# Patient Record
Sex: Male | Born: 1985 | Race: White | Hispanic: Yes | Marital: Married | Smoking: Current every day smoker
Health system: Southern US, Community
[De-identification: ages and names within clinical notes are randomized; demographics above are authoritative.]

---

## 2006-05-05 ENCOUNTER — Emergency Department (HOSPITAL_COMMUNITY): Admission: EM | Admit: 2006-05-05 | Discharge: 2006-05-05 | Payer: Self-pay | Admitting: Emergency Medicine
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
## 2017-03-14 ENCOUNTER — Emergency Department (HOSPITAL_COMMUNITY)
Admission: EM | Admit: 2017-03-14 | Discharge: 2017-03-14 | Disposition: A | Discharge status: Home / Self Care | Payer: Self-pay | Attending: Emergency Medicine | Admitting: Emergency Medicine

## 2017-03-14 ENCOUNTER — Encounter (HOSPITAL_COMMUNITY): Payer: Self-pay | Admitting: Emergency Medicine

## 2017-03-14 DIAGNOSIS — R1012 Left upper quadrant pain: Secondary | ICD-10-CM | POA: Insufficient documentation

## 2017-03-14 LAB — CBC WITH DIFFERENTIAL/PLATELET
BASOS PCT: 1 %
Basophils Absolute: 0 10*3/uL (ref 0.0–0.1)
EOS ABS: 0 10*3/uL (ref 0.0–0.7)
Eosinophils Relative: 1 %
HCT: 45.6 % (ref 39.0–52.0)
HEMOGLOBIN: 16 g/dL (ref 13.0–17.0)
Lymphocytes Relative: 31 %
Lymphs Abs: 2 10*3/uL (ref 0.7–4.0)
MCH: 29.8 pg (ref 26.0–34.0)
MCHC: 35.1 g/dL (ref 30.0–36.0)
MCV: 84.9 fL (ref 78.0–100.0)
MONO ABS: 0.4 10*3/uL (ref 0.1–1.0)
MONOS PCT: 6 %
NEUTROS ABS: 4 10*3/uL (ref 1.7–7.7)
Neutrophils Relative %: 61 %
PLATELETS: 256 10*3/uL (ref 150–400)
RBC: 5.37 MIL/uL (ref 4.22–5.81)
RDW: 12.1 % (ref 11.5–15.5)
WBC: 6.4 10*3/uL (ref 4.0–10.5)

## 2017-03-14 LAB — URINALYSIS, ROUTINE W REFLEX MICROSCOPIC
Bilirubin Urine: NEGATIVE
GLUCOSE, UA: NEGATIVE mg/dL
HGB URINE DIPSTICK: NEGATIVE
KETONES UR: NEGATIVE mg/dL
LEUKOCYTES UA: NEGATIVE
Nitrite: NEGATIVE
PROTEIN: NEGATIVE mg/dL
Specific Gravity, Urine: 1.013 (ref 1.005–1.030)
pH: 8 (ref 5.0–8.0)

## 2017-03-14 LAB — COMPREHENSIVE METABOLIC PANEL
ALBUMIN: 4.4 g/dL (ref 3.5–5.0)
ALK PHOS: 48 U/L (ref 38–126)
ALT: 27 U/L (ref 17–63)
ANION GAP: 6 (ref 5–15)
AST: 24 U/L (ref 15–41)
BUN: 9 mg/dL (ref 6–20)
CALCIUM: 9.4 mg/dL (ref 8.9–10.3)
CHLORIDE: 104 mmol/L (ref 101–111)
CO2: 27 mmol/L (ref 22–32)
CREATININE: 0.67 mg/dL (ref 0.61–1.24)
GFR calc Af Amer: >60 mL/min (ref >60)
GFR calc non Af Amer: >60 mL/min (ref >60)
GLUCOSE: 104 mg/dL — AB (ref 65–99)
Potassium: 4.2 mmol/L (ref 3.5–5.1)
SODIUM: 137 mmol/L (ref 135–145)
Total Bilirubin: 0.7 mg/dL (ref 0.3–1.2)
Total Protein: 7.1 g/dL (ref 6.5–8.1)

## 2017-03-14 LAB — LIPASE, BLOOD: LIPASE: 22 U/L (ref 11–51)

## 2017-03-14 MED ORDER — SUCRALFATE 1 GM/10ML PO SUSP: 1.0000 g | Freq: Three times a day (TID) | ORAL | 0 refills | Status: AC

## 2017-03-14 MED ORDER — GI COCKTAIL ~~LOC~~
30.0000 mL | Freq: Once | ORAL | Status: AC
  Administered 2017-03-14: 30 mL via ORAL
  Filled 2017-03-14: qty 30

## 2017-03-14 MED ORDER — PANTOPRAZOLE SODIUM 40 MG PO TBEC
40.0000 mg | DELAYED_RELEASE_TABLET | Freq: Every day | ORAL | Status: DC
  Administered 2017-03-14: 40 mg via ORAL
  Filled 2017-03-14: qty 1

## 2017-03-14 MED ORDER — PANTOPRAZOLE SODIUM 20 MG PO TBEC: 20.0000 mg | DELAYED_RELEASE_TABLET | Freq: Two times a day (BID) | ORAL | 0 refills | Status: AC

## 2017-03-14 NOTE — ED Triage Notes (Signed)
Pt c/o of 3/10 left sided lower abd pain that has been intermittent for the last 3 days. Denies any n/v/d.

## 2017-03-14 NOTE — ED Provider Notes (Signed)
[facility]-EMERGENCY DEPT Provider Note   CSN: [id number] Arrival date & time: 03/14/17  0846     History   Chief Complaint Chief Complaint  Patient presents with  . Abdominal Pain    HPI Juan Wilson is a 31 y.o. male.   Abdominal Pain   This is a new problem. The current episode started more than 2 days ago. The problem occurs constantly. The pain is located in the LLQ. The pain is at a severity of 5/10. The pain is mild. Pertinent negatives include anorexia, hematochezia and melena. Nothing aggravates the symptoms. Nothing relieves the symptoms. Past workup does not include GI consult.    History reviewed. No pertinent past medical history.  There are no active problems to display for this patient.   History reviewed. No pertinent surgical history.     Home Medications    Prior to Admission medications   Medication Sig Start Date End Date Taking? Authorizing Provider  acetaminophen (TYLENOL) 325 MG tablet Take 650 mg by mouth every 6 (six) hours as needed for mild pain.   Yes Historical Provider, MD  pantoprazole (PROTONIX) 20 MG tablet Take 1 tablet (20 mg total) by mouth 2 (two) times daily. 03/14/17   Marily Memos, MD  sucralfate (CARAFATE) 1 GM/10ML suspension Take 10 mLs (1 g total) by mouth 4 (four) times daily -  with meals and at bedtime. 03/14/17   Marily Memos, MD    Family History No family history on file.  Social History Social History  Substance Use Topics  . Smoking status: Not on file  . Smokeless tobacco: Not on file  . Alcohol use Not on file     Allergies   Patient has no known allergies.   Review of Systems Review of Systems  Gastrointestinal: Positive for abdominal pain. Negative for anorexia, hematochezia and melena.  All other systems reviewed and are negative.    Physical Exam Updated Vital Signs BP 105/88   Pulse 75   Temp 98.4 F (36.9 C) (Oral)   Resp 16   SpO2 93%   Physical Exam  Constitutional: He appears  well-developed and well-nourished.  HENT:  Head: Normocephalic and atraumatic.  Eyes: Conjunctivae and EOM are normal.  Neck: Normal range of motion.  Cardiovascular: Normal rate.   Pulmonary/Chest: Effort normal. No respiratory distress. He has no wheezes.  Abdominal: Soft. He exhibits no distension. There is no tenderness.  Musculoskeletal: Normal range of motion.  Neurological: He is alert.  Skin: Skin is warm and dry.  Nursing note and vitals reviewed.    ED Treatments / Results  Labs (all labs ordered are listed, but only abnormal results are displayed) Labs Reviewed  COMPREHENSIVE METABOLIC PANEL - Abnormal; Notable for the following:       Result Value   Glucose, Bld 104 (*)    All other components within normal limits  URINALYSIS, ROUTINE W REFLEX MICROSCOPIC  LIPASE, BLOOD  CBC WITH DIFFERENTIAL/PLATELET  H. PYLORI ANTIBODY, IGG    EKG  EKG Interpretation None       Radiology No results found.  Procedures Procedures (including critical care time)  Medications Ordered in ED Medications  gi cocktail (Maalox,Lidocaine,Donnatal) (30 mLs Oral Given 03/14/17 1027)     Initial Impression / Assessment and Plan / ED Course  I have reviewed the triage vital signs and the nursing notes.  Pertinent labs & imaging results that were available during my care of the patient were reviewed by me and considered in my medical  decision making (see chart for details).   Suspect likely ulcer disease, symptoms improved significantly with GI cocktail/PPI as well.   Final Clinical Impressions(s) / ED Diagnoses   Final diagnoses:  Left upper quadrant pain    New Prescriptions Discharge Medication List as of 03/14/2017  1:08 PM    START taking these medications   Details  pantoprazole (PROTONIX) 20 MG tablet Take 1 tablet (20 mg total) by mouth 2 (two) times daily., Starting Tue 03/14/2017, Print    sucralfate (CARAFATE) 1 GM/10ML suspension Take 10 mLs (1 g total) by  mouth 4 (four) times daily -  with meals and at bedtime., Starting Tue 03/14/2017, Print         Marily Memos, MD 03/14/17 [phone]

## 2017-03-15 LAB — H. PYLORI ANTIBODY, IGG

## 2017-03-17 ENCOUNTER — Encounter (HOSPITAL_COMMUNITY): Payer: Self-pay | Admitting: Emergency Medicine

## 2017-03-17 DIAGNOSIS — R1013 Epigastric pain: Secondary | ICD-10-CM | POA: Insufficient documentation

## 2017-03-17 DIAGNOSIS — F17210 Nicotine dependence, cigarettes, uncomplicated: Secondary | ICD-10-CM | POA: Insufficient documentation

## 2017-03-17 LAB — COMPREHENSIVE METABOLIC PANEL
ALT: 29 U/L (ref 17–63)
ANION GAP: 13 (ref 5–15)
AST: 32 U/L (ref 15–41)
Albumin: 4.7 g/dL (ref 3.5–5.0)
Alkaline Phosphatase: 47 U/L (ref 38–126)
BUN: 15 mg/dL (ref 6–20)
CHLORIDE: 101 mmol/L (ref 101–111)
CO2: 25 mmol/L (ref 22–32)
Calcium: 9.6 mg/dL (ref 8.9–10.3)
Creatinine, Ser: 0.85 mg/dL (ref 0.61–1.24)
Glucose, Bld: 99 mg/dL (ref 65–99)
POTASSIUM: 4.2 mmol/L (ref 3.5–5.1)
Sodium: 139 mmol/L (ref 135–145)
TOTAL PROTEIN: 7.3 g/dL (ref 6.5–8.1)
Total Bilirubin: 0.7 mg/dL (ref 0.3–1.2)

## 2017-03-17 LAB — URINALYSIS, ROUTINE W REFLEX MICROSCOPIC
Bilirubin Urine: NEGATIVE
GLUCOSE, UA: NEGATIVE mg/dL
Hgb urine dipstick: NEGATIVE
Ketones, ur: NEGATIVE mg/dL
LEUKOCYTES UA: NEGATIVE
NITRITE: NEGATIVE
PH: 6 (ref 5.0–8.0)
PROTEIN: NEGATIVE mg/dL
Specific Gravity, Urine: 1.005 (ref 1.005–1.030)

## 2017-03-17 LAB — CBC
HEMATOCRIT: 44.4 % (ref 39.0–52.0)
HEMOGLOBIN: 15.9 g/dL (ref 13.0–17.0)
MCH: 30.3 pg (ref 26.0–34.0)
MCHC: 35.8 g/dL (ref 30.0–36.0)
MCV: 84.7 fL (ref 78.0–100.0)
Platelets: 272 10*3/uL (ref 150–400)
RBC: 5.24 MIL/uL (ref 4.22–5.81)
RDW: 12.1 % (ref 11.5–15.5)
WBC: 8.8 10*3/uL (ref 4.0–10.5)

## 2017-03-17 LAB — LIPASE, BLOOD: LIPASE: 23 U/L (ref 11–51)

## 2017-03-17 NOTE — ED Triage Notes (Signed)
Pt c/o 6/10 LLQ abd pain, was seen on Ed 3 days ago and sent home, pt states he did get any better, feeling nauseated, denies fever, chills or diarrhea.

## 2017-03-18 ENCOUNTER — Emergency Department (HOSPITAL_COMMUNITY): Payer: Self-pay

## 2017-03-18 ENCOUNTER — Emergency Department (HOSPITAL_COMMUNITY)
Admission: EM | Admit: 2017-03-18 | Discharge: 2017-03-18 | Disposition: A | Discharge status: Home / Self Care | Payer: Self-pay | Attending: Emergency Medicine | Admitting: Emergency Medicine

## 2017-03-18 ENCOUNTER — Encounter (HOSPITAL_COMMUNITY): Payer: Self-pay | Admitting: Radiology

## 2017-03-18 DIAGNOSIS — R109 Unspecified abdominal pain: Secondary | ICD-10-CM

## 2017-03-18 MED ORDER — IOPAMIDOL (ISOVUE-300) INJECTION 61%
INTRAVENOUS | Status: AC
  Administered 2017-03-18: 100 mL
  Filled 2017-03-18: qty 100

## 2017-03-18 MED ORDER — FAMOTIDINE 20 MG PO TABS: 20.0000 mg | ORAL_TABLET | Freq: Two times a day (BID) | ORAL | 0 refills | Status: AC

## 2017-03-18 NOTE — ED Provider Notes (Signed)
[facility]Y [facility]PT Provider Note   CSN: [id number] Arrival date & time: 03/17/17  2102  By signing my name below, I, Freida Busman, attest that this documentation has been prepared under the direction and in the presence of Pricilla Loveless, MD . Electronically Signed: Freida Busman, Scribe. 03/18/2017. 1:28 AM.  History   Chief Complaint Chief Complaint  Patient presents with  . Abdominal Pain    The history is provided by the patient and medical records. No language interpreter was used.     HPI Comments:  Juan Wilson is a 31 y.o. male who presents to the Emergency Department complaining of intermittent, sharp, burning, abdominal pain x 2 weeks. He notes pain to the epigastric, peri-umbilical and left abdomen. No exacerbating factors noted. He reports associated dysuria x 2 weeks and an episode of nausea today, that has resolved at this time.  Pt was seen in the ED on 03/14/2017 for abdominal pain and was discharged home with carafate and Protonix which have provided little relief. Pt denies penile discharge, hematuria, vomitng diarrhea, and constipation. His last normal BM was today.   History reviewed. No pertinent past medical history.  There are no active problems to display for this patient.   History reviewed. No pertinent surgical history.     Home Medications    Prior to Admission medications   Medication Sig Start Date End Date Taking? Authorizing Provider  acetaminophen (TYLENOL) 325 MG tablet Take 650 mg by mouth every 6 (six) hours as needed for mild pain.    Historical Provider, MD  famotidine (PEPCID) 20 MG tablet Take 1 tablet (20 mg total) by mouth 2 (two) times daily. 03/18/17   Pricilla Loveless, MD  pantoprazole (PROTONIX) 20 MG tablet Take 1 tablet (20 mg total) by mouth 2 (two) times daily. 03/14/17   Marily Memos, MD  sucralfate (CARAFATE) 1 GM/10ML suspension Take 10 mLs (1 g total) by mouth 4 (four) times daily -  with meals and at bedtime. 03/14/17    Marily Memos, MD    Family History History reviewed. No pertinent family history.  Social History Social History  Substance Use Topics  . Smoking status: Current Every Day Smoker    Packs/day: 0.50    Types: Cigarettes  . Smokeless tobacco: Never Used  . Alcohol use No     Allergies   Patient has no known allergies.   Review of Systems Review of Systems  Gastrointestinal: Positive for abdominal pain and nausea. Negative for constipation, diarrhea and vomiting.  Genitourinary: Positive for dysuria. Negative for discharge and hematuria.  All other systems reviewed and are negative.    Physical Exam Updated Vital Signs BP 118/80   Pulse 68   Temp 97.9 F (36.6 C) (Oral)   Resp 16   Ht  (1.88 m)   Wt 187 lb (84.8 kg)   SpO2 99%   BMI 24.01 kg/m   Physical Exam  Constitutional: He is oriented to person, place, and time. He appears well-developed and well-nourished.  HENT:  Head: Normocephalic and atraumatic.  Right Ear: External ear normal.  Left Ear: External ear normal.  Nose: Nose normal.  Eyes: Right eye exhibits no discharge. Left eye exhibits no discharge.  Neck: Neck supple.  Cardiovascular: Normal rate, regular rhythm and normal heart sounds.   Pulmonary/Chest: Effort normal and breath sounds normal.  Abdominal: Soft. He exhibits no distension. There is tenderness (mld RLQ ). There is no CVA tenderness.  Genitourinary:  Genitourinary Comments: Nml penis with no  discharge or erythema  Nml testicles with no tenderness or swelling   Musculoskeletal: He exhibits no edema.  Neurological: He is alert and oriented to person, place, and time.  Skin: Skin is warm and dry.  Nursing note and vitals reviewed.    ED Treatments / Results  DIAGNOSTIC STUDIES:  Oxygen Saturation is 100% on RA, normal by my interpretation.    COORDINATION OF CARE:  1:27 AM Discussed treatment plan with pt at bedside and pt agreed to plan.  Labs (all labs ordered are  listed, but only abnormal results are displayed) Labs Reviewed  URINALYSIS, ROUTINE W REFLEX MICROSCOPIC - Abnormal; Notable for the following:       Result Value   Color, Urine STRAW (*)    All other components within normal limits  LIPASE, BLOOD  COMPREHENSIVE METABOLIC PANEL  CBC  GC/CHLAMYDIA PROBE AMP ([facility]) NOT AT [facility]    EKG  EKG Interpretation None       Radiology Ct Abdomen Pelvis W Contrast  Result Date: 03/18/2017 CLINICAL DATA:  31 year old male with diffuse abdominal pain. EXAM: CT ABDOMEN AND PELVIS WITH CONTRAST TECHNIQUE: Multidetector CT imaging of the abdomen and pelvis was performed using the standard protocol following bolus administration of intravenous contrast. CONTRAST:  ISOVUE-300 IOPAMIDOL (ISOVUE-300) INJECTION 61% COMPARISON:  None. FINDINGS: Lower chest: The visualized lung bases are clear. No intra-abdominal free air or free fluid. Hepatobiliary: The dome of the liver is not included in the images. The visualized portion of the liver appear unremarkable. No gallstones. Pancreas: Unremarkable. No pancreatic ductal dilatation or surrounding inflammatory changes. Spleen: Normal in size without focal abnormality. Adrenals/Urinary Tract: Adrenal glands are unremarkable. Kidneys are normal, without renal calculi, focal lesion, or hydronephrosis. Bladder is unremarkable. Stomach/Bowel: Large amount of stool noted throughout the colon. There is no evidence of bowel obstruction or active inflammation. The appendix is not visualized and may be surgically absent. There is no inflammatory changes in the right lower quadrant or secondary signs of acute appendicitis. Vascular/Lymphatic: The abdominal aorta and IVC appear unremarkable. There is a circumaortic left renal vein anatomy. No portal venous gas identified. There is no adenopathy. Reproductive: The prostate and seminal vesicles are grossly unremarkable. Other: None Musculoskeletal: No acute or significant  osseous findings. IMPRESSION: Moderate colonic stool burden otherwise unremarkable CT of the abdomen pelvis. Electronically Signed   By: Elgie Collard M.D.   On: 03/18/2017 03:07    Procedures Procedures (including critical care time)  Medications Ordered in ED Medications  iopamidol (ISOVUE-300) 61 % injection (100 mLs  Contrast Given 03/18/17 0243)     Initial Impression / Assessment and Plan / ED Course  I have reviewed the triage vital signs and the nursing notes.  Pertinent labs & imaging results that were available during my care of the patient were reviewed by me and considered in my medical decision making (see chart for details).     Patient has mild RLQ tenderness. No obvious pathology including no ureteral stone or appendicitis. Urine benign. Labs from last visit reviewed, h pylori negative. Unclear etiology of his 2 week pain. Will refer to GI. Low suspicion for abdominal emergency. Will add on H2 blocker. Discussed return precautions.   Final Clinical Impressions(s) / ED Diagnoses   Final diagnoses:  Abdominal pain, unspecified abdominal location    New Prescriptions Discharge Medication List as of 03/18/2017  4:01 AM    START taking these medications   Details  famotidine (PEPCID) 20 MG tablet Take 1  tablet (20 mg total) by mouth 2 (two) times daily., Starting Sat 03/18/2017, Print       I personally performed the services described in this documentation, which was scribed in my presence. The recorded information has been reviewed and is accurate.     Pricilla Loveless, MD 03/18/17 [phone]

## 2017-03-20 LAB — GC/CHLAMYDIA PROBE AMP (~~LOC~~) NOT AT ARMC
Chlamydia: NEGATIVE
Neisseria Gonorrhea: NEGATIVE
